# Patient Record
Sex: Male | Born: 1995 | Race: Black or African American | Hispanic: No | Marital: Single | Smoking: Never smoker
Health system: Southern US, Community
[De-identification: ages and names within clinical notes are randomized; demographics above are authoritative.]

---
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
## 2015-12-19 ENCOUNTER — Emergency Department (HOSPITAL_COMMUNITY): Payer: No Typology Code available for payment source

## 2015-12-19 ENCOUNTER — Encounter (HOSPITAL_COMMUNITY): Payer: Self-pay | Admitting: Emergency Medicine

## 2015-12-19 ENCOUNTER — Emergency Department (HOSPITAL_COMMUNITY)
Admission: EM | Admit: 2015-12-19 | Discharge: 2015-12-19 | Disposition: A | Discharge status: Home / Self Care | Payer: No Typology Code available for payment source | Attending: Emergency Medicine | Admitting: Emergency Medicine

## 2015-12-19 DIAGNOSIS — S199XXA Unspecified injury of neck, initial encounter: Secondary | ICD-10-CM | POA: Diagnosis present

## 2015-12-19 DIAGNOSIS — Y92410 Unspecified street and highway as the place of occurrence of the external cause: Secondary | ICD-10-CM | POA: Insufficient documentation

## 2015-12-19 DIAGNOSIS — Y999 Unspecified external cause status: Secondary | ICD-10-CM | POA: Insufficient documentation

## 2015-12-19 DIAGNOSIS — S161XXA Strain of muscle, fascia and tendon at neck level, initial encounter: Secondary | ICD-10-CM | POA: Insufficient documentation

## 2015-12-19 DIAGNOSIS — Y9389 Activity, other specified: Secondary | ICD-10-CM | POA: Insufficient documentation

## 2015-12-19 MED ORDER — NAPROXEN 500 MG PO TABS: 500.0000 mg | ORAL_TABLET | Freq: Two times a day (BID) | ORAL | 0 refills | Status: AC

## 2015-12-19 MED ORDER — CYCLOBENZAPRINE HCL 10 MG PO TABS: 10.0000 mg | ORAL_TABLET | Freq: Two times a day (BID) | ORAL | 0 refills | Status: AC | PRN
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
